# Patient Record
Sex: Male | Born: 2000 | Race: Black or African American | Hispanic: No | Marital: Single | Smoking: Never smoker
Health system: Southern US, Community
[De-identification: ages and names within clinical notes are randomized; demographics above are authoritative.]

## PROBLEM LIST (undated history)

## (undated) DIAGNOSIS — F319 Bipolar disorder, unspecified: Secondary | ICD-10-CM

## (undated) DIAGNOSIS — E559 Vitamin D deficiency, unspecified: Secondary | ICD-10-CM

## (undated) DIAGNOSIS — F909 Attention-deficit hyperactivity disorder, unspecified type: Secondary | ICD-10-CM

## (undated) DIAGNOSIS — K5090 Crohn's disease, unspecified, without complications: Secondary | ICD-10-CM

## (undated) DIAGNOSIS — Z8659 Personal history of other mental and behavioral disorders: Secondary | ICD-10-CM

## (undated) HISTORY — DX: Attention-deficit hyperactivity disorder, unspecified type: F90.9

## (undated) HISTORY — DX: Crohn's disease, unspecified, without complications: K50.90

## (undated) HISTORY — DX: Vitamin D deficiency, unspecified: E55.9

---
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
## 2021-08-28 ENCOUNTER — Other Ambulatory Visit: Payer: Self-pay

## 2021-08-28 ENCOUNTER — Emergency Department (HOSPITAL_COMMUNITY)
Admission: EM | Admit: 2021-08-28 | Discharge: 2021-08-28 | Disposition: A | Discharge status: Home / Self Care | Payer: No Typology Code available for payment source | Attending: Emergency Medicine | Admitting: Emergency Medicine

## 2021-08-28 ENCOUNTER — Emergency Department (HOSPITAL_COMMUNITY): Payer: No Typology Code available for payment source

## 2021-08-28 DIAGNOSIS — Y92410 Unspecified street and highway as the place of occurrence of the external cause: Secondary | ICD-10-CM | POA: Diagnosis not present

## 2021-08-28 DIAGNOSIS — S301XXA Contusion of abdominal wall, initial encounter: Secondary | ICD-10-CM | POA: Insufficient documentation

## 2021-08-28 DIAGNOSIS — S8992XA Unspecified injury of left lower leg, initial encounter: Secondary | ICD-10-CM | POA: Diagnosis present

## 2021-08-28 DIAGNOSIS — S86912A Strain of unspecified muscle(s) and tendon(s) at lower leg level, left leg, initial encounter: Secondary | ICD-10-CM

## 2021-08-28 DIAGNOSIS — S8392XA Sprain of unspecified site of left knee, initial encounter: Secondary | ICD-10-CM | POA: Insufficient documentation

## 2021-08-28 LAB — I-STAT CHEM 8, ED
BUN: 10 mg/dL (ref 6–20)
Calcium, Ion: 1.21 mmol/L (ref 1.15–1.40)
Chloride: 100 mmol/L (ref 98–111)
Creatinine, Ser: 0.8 mg/dL (ref 0.61–1.24)
Glucose, Bld: 94 mg/dL (ref 70–99)
HCT: 48 % (ref 39.0–52.0)
Hemoglobin: 16.3 g/dL (ref 13.0–17.0)
Potassium: 3.8 mmol/L (ref 3.5–5.1)
Sodium: 138 mmol/L (ref 135–145)
TCO2: 28 mmol/L (ref 22–32)

## 2021-08-28 LAB — COMPREHENSIVE METABOLIC PANEL
ALT: 20 U/L (ref 0–44)
AST: 16 U/L (ref 15–41)
Albumin: 4.1 g/dL (ref 3.5–5.0)
Alkaline Phosphatase: 91 U/L (ref 38–126)
Anion gap: 8 (ref 5–15)
BUN: 9 mg/dL (ref 6–20)
CO2: 27 mmol/L (ref 22–32)
Calcium: 9.5 mg/dL (ref 8.9–10.3)
Chloride: 102 mmol/L (ref 98–111)
Creatinine, Ser: 0.83 mg/dL (ref 0.61–1.24)
GFR, Estimated: >60 mL/min (ref >60)
Glucose, Bld: 97 mg/dL (ref 70–99)
Potassium: 3.8 mmol/L (ref 3.5–5.1)
Sodium: 137 mmol/L (ref 135–145)
Total Bilirubin: 0.4 mg/dL (ref 0.3–1.2)
Total Protein: 8 g/dL (ref 6.5–8.1)

## 2021-08-28 LAB — CBC WITH DIFFERENTIAL/PLATELET
Abs Immature Granulocytes: 0.03 10*3/uL (ref 0.00–0.07)
Basophils Absolute: 0 10*3/uL (ref 0.0–0.1)
Basophils Relative: 0 %
Eosinophils Absolute: 0.1 10*3/uL (ref 0.0–0.5)
Eosinophils Relative: 1 %
HCT: 45.7 % (ref 39.0–52.0)
Hemoglobin: 14.7 g/dL (ref 13.0–17.0)
Immature Granulocytes: 0 %
Lymphocytes Relative: 25 %
Lymphs Abs: 1.9 10*3/uL (ref 0.7–4.0)
MCH: 26 pg (ref 26.0–34.0)
MCHC: 32.2 g/dL (ref 30.0–36.0)
MCV: 80.7 fL (ref 80.0–100.0)
Monocytes Absolute: 0.5 10*3/uL (ref 0.1–1.0)
Monocytes Relative: 7 %
Neutro Abs: 5.1 10*3/uL (ref 1.7–7.7)
Neutrophils Relative %: 67 %
Platelets: 278 10*3/uL (ref 150–400)
RBC: 5.66 MIL/uL (ref 4.22–5.81)
RDW: 15.6 % — ABNORMAL HIGH (ref 11.5–15.5)
WBC: 7.6 10*3/uL (ref 4.0–10.5)
nRBC: 0 % (ref 0.0–0.2)

## 2021-08-28 MED ORDER — IOHEXOL 300 MG/ML  SOLN
100.0000 mL | Freq: Once | INTRAMUSCULAR | Status: AC | PRN
  Administered 2021-08-28: 100 mL via INTRAVENOUS

## 2021-08-28 MED ORDER — TRAMADOL HCL 50 MG PO TABS: 50.0000 mg | ORAL_TABLET | Freq: Four times a day (QID) | ORAL | 0 refills | Status: DC | PRN
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
## 2021-08-28 NOTE — Discharge Instructions (Addendum)
You do not have any bleeding or fractures right now  Take Motrin for pain.  Take tramadol for muscle spasms or severe pain  See your doctor for follow  Return to ER if you have worse abdominal pain, knee pain
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
## 2021-09-05 ENCOUNTER — Emergency Department (HOSPITAL_COMMUNITY): Payer: No Typology Code available for payment source

## 2021-09-05 ENCOUNTER — Emergency Department (HOSPITAL_COMMUNITY)
Admission: EM | Admit: 2021-09-05 | Discharge: 2021-09-05 | Disposition: A | Discharge status: Home / Self Care | Payer: No Typology Code available for payment source | Attending: Emergency Medicine | Admitting: Emergency Medicine

## 2021-09-05 DIAGNOSIS — R109 Unspecified abdominal pain: Secondary | ICD-10-CM | POA: Diagnosis not present

## 2021-09-05 DIAGNOSIS — M25562 Pain in left knee: Secondary | ICD-10-CM | POA: Insufficient documentation

## 2021-09-05 DIAGNOSIS — M25552 Pain in left hip: Secondary | ICD-10-CM | POA: Insufficient documentation

## 2021-09-05 LAB — BASIC METABOLIC PANEL
Anion gap: 11 (ref 5–15)
BUN: 6 mg/dL (ref 6–20)
CO2: 22 mmol/L (ref 22–32)
Calcium: 9 mg/dL (ref 8.9–10.3)
Chloride: 104 mmol/L (ref 98–111)
Creatinine, Ser: 0.83 mg/dL (ref 0.61–1.24)
GFR, Estimated: >60 mL/min (ref >60)
Glucose, Bld: 99 mg/dL (ref 70–99)
Potassium: 3.7 mmol/L (ref 3.5–5.1)
Sodium: 137 mmol/L (ref 135–145)

## 2021-09-05 LAB — CBC WITH DIFFERENTIAL/PLATELET
Abs Immature Granulocytes: 0.03 10*3/uL (ref 0.00–0.07)
Basophils Absolute: 0 10*3/uL (ref 0.0–0.1)
Basophils Relative: 0 %
Eosinophils Absolute: 0.1 10*3/uL (ref 0.0–0.5)
Eosinophils Relative: 1 %
HCT: 43.9 % (ref 39.0–52.0)
Hemoglobin: 14.3 g/dL (ref 13.0–17.0)
Immature Granulocytes: 0 %
Lymphocytes Relative: 21 %
Lymphs Abs: 1.9 10*3/uL (ref 0.7–4.0)
MCH: 26.5 pg (ref 26.0–34.0)
MCHC: 32.6 g/dL (ref 30.0–36.0)
MCV: 81.4 fL (ref 80.0–100.0)
Monocytes Absolute: 0.6 10*3/uL (ref 0.1–1.0)
Monocytes Relative: 7 %
Neutro Abs: 6.3 10*3/uL (ref 1.7–7.7)
Neutrophils Relative %: 71 %
Platelets: 278 10*3/uL (ref 150–400)
RBC: 5.39 MIL/uL (ref 4.22–5.81)
RDW: 16.1 % — ABNORMAL HIGH (ref 11.5–15.5)
WBC: 9 10*3/uL (ref 4.0–10.5)
nRBC: 0 % (ref 0.0–0.2)

## 2021-09-05 MED ORDER — ACETAMINOPHEN 500 MG PO TABS
1000.0000 mg | ORAL_TABLET | Freq: Once | ORAL | Status: AC
  Administered 2021-09-05: 1000 mg via ORAL
  Filled 2021-09-05: qty 2
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
## 2021-09-05 NOTE — ED Triage Notes (Signed)
Pt. Refused to sign the MSE stated I want my mom to sign.
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
## 2021-09-05 NOTE — Discharge Instructions (Signed)
Please follow up with Orthopedist Dr. Roda Shutters for further evaluation of your ongoing knee pain  Use the crutches and knee immobilizer until you can  be seen by ortho.   You can take 800 mg Ibuprofen (4 OTC tablets) every 8 hours as needed for pain. You can alternate with 1,000 mg Tylenol (2 OTC double strength tablets) every 8 hours. It is recommended that you take Ibuprofen and then at the 4 hour  mark take Tylenol. This way every 4 hours you are taking something for pain.   Pick up the prescription for Tramadol that was prescribed to you last week during your ED visit. You should take this as prescribed but only for break through pain.   DO NOT TAKE ADDITIONAL NSAIDs WITH YOUR IBUPROFEN. THIS INCLUDES ADVIL OR ALEVE.   Return to the ED for any new/worsening symptoms

## 2021-09-05 NOTE — ED Notes (Signed)
Ortho called for device placement.
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
## 2022-06-17 ENCOUNTER — Emergency Department (HOSPITAL_BASED_OUTPATIENT_CLINIC_OR_DEPARTMENT_OTHER)
Admission: EM | Admit: 2022-06-17 | Discharge: 2022-06-17 | Disposition: A | Discharge status: Home / Self Care | Payer: Medicaid Other | Attending: Emergency Medicine | Admitting: Emergency Medicine

## 2022-06-17 ENCOUNTER — Other Ambulatory Visit: Payer: Self-pay

## 2022-06-17 ENCOUNTER — Encounter (HOSPITAL_BASED_OUTPATIENT_CLINIC_OR_DEPARTMENT_OTHER): Payer: Self-pay | Admitting: Emergency Medicine

## 2022-06-17 DIAGNOSIS — S0990XA Unspecified injury of head, initial encounter: Secondary | ICD-10-CM | POA: Diagnosis present

## 2022-06-17 DIAGNOSIS — Z79899 Other long term (current) drug therapy: Secondary | ICD-10-CM | POA: Diagnosis not present

## 2022-06-17 DIAGNOSIS — Y92410 Unspecified street and highway as the place of occurrence of the external cause: Secondary | ICD-10-CM | POA: Diagnosis not present

## 2022-06-17 DIAGNOSIS — S161XXA Strain of muscle, fascia and tendon at neck level, initial encounter: Secondary | ICD-10-CM | POA: Diagnosis not present

## 2022-06-17 MED ORDER — METHOCARBAMOL 500 MG PO TABS: 500.0000 mg | ORAL_TABLET | Freq: Two times a day (BID) | ORAL | 0 refills | Status: AC

## 2022-06-17 MED ORDER — NAPROXEN 500 MG PO TABS: 500.0000 mg | ORAL_TABLET | Freq: Two times a day (BID) | ORAL | 0 refills | Status: AC

## 2022-06-17 MED ORDER — NAPROXEN 250 MG PO TABS
500.0000 mg | ORAL_TABLET | Freq: Once | ORAL | Status: AC
  Administered 2022-06-17: 500 mg via ORAL
  Filled 2022-06-17: qty 2

## 2022-06-17 MED ORDER — METHOCARBAMOL 500 MG PO TABS
500.0000 mg | ORAL_TABLET | Freq: Once | ORAL | Status: AC
Start: 2022-06-17 — End: 2022-06-17
  Administered 2022-06-17: 500 mg via ORAL
  Filled 2022-06-17: qty 1
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
## 2022-06-21 ENCOUNTER — Emergency Department (HOSPITAL_BASED_OUTPATIENT_CLINIC_OR_DEPARTMENT_OTHER): Payer: Medicaid Other

## 2022-06-21 ENCOUNTER — Other Ambulatory Visit: Payer: Self-pay

## 2022-06-21 ENCOUNTER — Emergency Department (HOSPITAL_BASED_OUTPATIENT_CLINIC_OR_DEPARTMENT_OTHER)
Admission: EM | Admit: 2022-06-21 | Discharge: 2022-06-21 | Disposition: A | Discharge status: Home / Self Care | Payer: Medicaid Other | Attending: Emergency Medicine | Admitting: Emergency Medicine

## 2022-06-21 DIAGNOSIS — S0990XA Unspecified injury of head, initial encounter: Secondary | ICD-10-CM | POA: Diagnosis present

## 2022-06-21 DIAGNOSIS — Y92410 Unspecified street and highway as the place of occurrence of the external cause: Secondary | ICD-10-CM | POA: Insufficient documentation

## 2022-06-21 DIAGNOSIS — M542 Cervicalgia: Secondary | ICD-10-CM | POA: Insufficient documentation
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
## 2022-06-30 ENCOUNTER — Other Ambulatory Visit: Payer: Self-pay

## 2022-06-30 ENCOUNTER — Encounter (HOSPITAL_BASED_OUTPATIENT_CLINIC_OR_DEPARTMENT_OTHER): Payer: Self-pay | Admitting: Emergency Medicine

## 2022-06-30 ENCOUNTER — Emergency Department (HOSPITAL_BASED_OUTPATIENT_CLINIC_OR_DEPARTMENT_OTHER): Payer: Medicaid Other

## 2022-06-30 ENCOUNTER — Emergency Department (HOSPITAL_BASED_OUTPATIENT_CLINIC_OR_DEPARTMENT_OTHER)
Admission: EM | Admit: 2022-06-30 | Discharge: 2022-06-30 | Disposition: A | Discharge status: Home / Self Care | Payer: Medicaid Other | Attending: Emergency Medicine | Admitting: Emergency Medicine

## 2022-06-30 DIAGNOSIS — Y92410 Unspecified street and highway as the place of occurrence of the external cause: Secondary | ICD-10-CM | POA: Insufficient documentation

## 2022-06-30 DIAGNOSIS — M542 Cervicalgia: Secondary | ICD-10-CM | POA: Diagnosis not present

## 2022-06-30 DIAGNOSIS — S0990XA Unspecified injury of head, initial encounter: Secondary | ICD-10-CM | POA: Insufficient documentation

## 2022-06-30 DIAGNOSIS — S0990XD Unspecified injury of head, subsequent encounter: Secondary | ICD-10-CM

## 2022-06-30 NOTE — ED Notes (Signed)
Pt discharged home after verbalizing understanding of discharge instructions; nad noted. 
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
## 2022-06-30 NOTE — ED Notes (Signed)
Patient transported to CT 

## 2022-07-30 ENCOUNTER — Encounter: Payer: Self-pay | Admitting: Internal Medicine

## 2022-08-15 ENCOUNTER — Inpatient Hospital Stay (HOSPITAL_COMMUNITY): Admission: RE | Admit: 2022-08-15 | Payer: Self-pay | Source: Ambulatory Visit

## 2022-08-15 ENCOUNTER — Emergency Department (HOSPITAL_BASED_OUTPATIENT_CLINIC_OR_DEPARTMENT_OTHER)
Admission: EM | Admit: 2022-08-15 | Discharge: 2022-08-15 | Disposition: A | Discharge status: Home / Self Care | Payer: Medicaid Other | Attending: Emergency Medicine | Admitting: Emergency Medicine

## 2022-08-15 ENCOUNTER — Encounter (HOSPITAL_BASED_OUTPATIENT_CLINIC_OR_DEPARTMENT_OTHER): Payer: Self-pay | Admitting: Emergency Medicine

## 2022-08-15 ENCOUNTER — Other Ambulatory Visit: Payer: Self-pay

## 2022-08-15 DIAGNOSIS — R197 Diarrhea, unspecified: Secondary | ICD-10-CM | POA: Diagnosis not present

## 2022-08-15 DIAGNOSIS — R519 Headache, unspecified: Secondary | ICD-10-CM

## 2022-08-15 DIAGNOSIS — R112 Nausea with vomiting, unspecified: Secondary | ICD-10-CM | POA: Insufficient documentation

## 2022-08-15 DIAGNOSIS — R509 Fever, unspecified: Secondary | ICD-10-CM

## 2022-08-15 DIAGNOSIS — Z1152 Encounter for screening for COVID-19: Secondary | ICD-10-CM | POA: Diagnosis not present

## 2022-08-15 LAB — CBC WITH DIFFERENTIAL/PLATELET
Abs Immature Granulocytes: 0.05 10*3/uL (ref 0.00–0.07)
Basophils Absolute: 0 10*3/uL (ref 0.0–0.1)
Basophils Relative: 0 %
Eosinophils Absolute: 0.1 10*3/uL (ref 0.0–0.5)
Eosinophils Relative: 1 %
HCT: 49.7 % (ref 39.0–52.0)
Hemoglobin: 16.2 g/dL (ref 13.0–17.0)
Immature Granulocytes: 0 %
Lymphocytes Relative: 4 %
Lymphs Abs: 0.5 10*3/uL — ABNORMAL LOW (ref 0.7–4.0)
MCH: 25.8 pg — ABNORMAL LOW (ref 26.0–34.0)
MCHC: 32.6 g/dL (ref 30.0–36.0)
MCV: 79 fL — ABNORMAL LOW (ref 80.0–100.0)
Monocytes Absolute: 0.5 10*3/uL (ref 0.1–1.0)
Monocytes Relative: 4 %
Neutro Abs: 10.9 10*3/uL — ABNORMAL HIGH (ref 1.7–7.7)
Neutrophils Relative %: 91 %
Platelets: 293 10*3/uL (ref 150–400)
RBC: 6.29 MIL/uL — ABNORMAL HIGH (ref 4.22–5.81)
RDW: 15.1 % (ref 11.5–15.5)
WBC: 12 10*3/uL — ABNORMAL HIGH (ref 4.0–10.5)
nRBC: 0 % (ref 0.0–0.2)

## 2022-08-15 LAB — URINALYSIS, ROUTINE W REFLEX MICROSCOPIC
Bacteria, UA: NONE SEEN
Bilirubin Urine: NEGATIVE
Glucose, UA: NEGATIVE mg/dL
Hgb urine dipstick: NEGATIVE
Ketones, ur: 40 mg/dL — AB
Leukocytes,Ua: NEGATIVE
Nitrite: NEGATIVE
Protein, ur: 30 mg/dL — AB
Specific Gravity, Urine: 1.039 — ABNORMAL HIGH (ref 1.005–1.030)
pH: 5.5 (ref 5.0–8.0)

## 2022-08-15 LAB — COMPREHENSIVE METABOLIC PANEL
ALT: 19 U/L (ref 0–44)
AST: 14 U/L — ABNORMAL LOW (ref 15–41)
Albumin: 4.8 g/dL (ref 3.5–5.0)
Alkaline Phosphatase: 86 U/L (ref 38–126)
Anion gap: 13 (ref 5–15)
BUN: 14 mg/dL (ref 6–20)
CO2: 22 mmol/L (ref 22–32)
Calcium: 10.1 mg/dL (ref 8.9–10.3)
Chloride: 100 mmol/L (ref 98–111)
Creatinine, Ser: 0.96 mg/dL (ref 0.61–1.24)
GFR, Estimated: >60 mL/min (ref >60)
Glucose, Bld: 113 mg/dL — ABNORMAL HIGH (ref 70–99)
Potassium: 4.2 mmol/L (ref 3.5–5.1)
Sodium: 135 mmol/L (ref 135–145)
Total Bilirubin: 0.9 mg/dL (ref 0.3–1.2)
Total Protein: 9.1 g/dL — ABNORMAL HIGH (ref 6.5–8.1)

## 2022-08-15 LAB — RESP PANEL BY RT-PCR (RSV, FLU A&B, COVID)  RVPGX2
Influenza A by PCR: NEGATIVE
Influenza B by PCR: NEGATIVE
Resp Syncytial Virus by PCR: NEGATIVE
SARS Coronavirus 2 by RT PCR: NEGATIVE

## 2022-08-15 LAB — LIPASE, BLOOD: Lipase: 61 U/L — ABNORMAL HIGH (ref 11–51)

## 2022-08-15 MED ORDER — SODIUM CHLORIDE 0.9 % IV SOLN
12.5000 mg | Freq: Four times a day (QID) | INTRAVENOUS | Status: DC | PRN
  Filled 2022-08-15: qty 0.5

## 2022-08-15 MED ORDER — ACETAMINOPHEN 325 MG PO TABS
650.0000 mg | ORAL_TABLET | Freq: Once | ORAL | Status: AC
  Administered 2022-08-15: 650 mg via ORAL

## 2022-08-15 MED ORDER — ACETAMINOPHEN 325 MG PO TABS
ORAL_TABLET | ORAL | Status: AC
  Filled 2022-08-15: qty 1

## 2022-08-15 MED ORDER — DIPHENHYDRAMINE HCL 50 MG/ML IJ SOLN
25.0000 mg | Freq: Once | INTRAMUSCULAR | Status: AC
  Administered 2022-08-15: 25 mg via INTRAVENOUS
  Filled 2022-08-15: qty 1

## 2022-08-15 MED ORDER — METOCLOPRAMIDE HCL 5 MG/ML IJ SOLN
10.0000 mg | Freq: Once | INTRAMUSCULAR | Status: AC
  Administered 2022-08-15: 10 mg via INTRAVENOUS
  Filled 2022-08-15: qty 2

## 2022-08-15 MED ORDER — KETOROLAC TROMETHAMINE 15 MG/ML IJ SOLN
15.0000 mg | Freq: Once | INTRAMUSCULAR | Status: AC
  Administered 2022-08-15: 15 mg via INTRAVENOUS
  Filled 2022-08-15: qty 1

## 2022-08-15 MED ORDER — SODIUM CHLORIDE 0.9 % IV BOLUS
2000.0000 mL | Freq: Once | INTRAVENOUS | Status: AC
  Administered 2022-08-15: 2000 mL via INTRAVENOUS
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
## 2022-08-15 NOTE — ED Notes (Signed)
EDP at [facility] 

## 2022-08-15 NOTE — ED Triage Notes (Signed)
Pt c/o abd pain, emesis, headache since 1400 today, pt reports SOB and sudden onset numbness "all over his body" after arriving

## 2022-08-15 NOTE — ED Notes (Addendum)
Pt placed in w/c as pt states he is unable to move due to "being too weak", no difficulty noted upon entering triage (pt ambulated with no difficulty noted or reported) or when pacing in triage room and attempting to get into floor, this RN advised pt and family member no rooms available at this time, however, pt will be roomed ASAP, urine specimen cup given to pt and advised sample is needed
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
## 2022-08-15 NOTE — ED Notes (Signed)
Pt attempting to lie in floor during triage stating he is unable to breathe, O2 sat 100%, pt able to speak in complete sentences and does not appear to be in any respiratory distress; pt states he is numb all over his body, pt later states bilateral fingers are numb

## 2022-08-15 NOTE — Discharge Instructions (Addendum)
Call to make an appointment with one of the primary care doctors listed above.  Return to the emergency room if you have any worsening symptoms.

## 2022-08-15 NOTE — ED Notes (Signed)
Pt refused to sign MSE

## 2022-08-15 NOTE — ED Notes (Signed)
This RN offered ODT Zofran to pt for nausea, pt states he "I can't take that. It makes me sicker because of the taste"

## 2022-12-29 ENCOUNTER — Encounter: Payer: Self-pay | Admitting: Gastroenterology

## 2023-03-24 ENCOUNTER — Other Ambulatory Visit: Payer: Self-pay | Admitting: Internal Medicine

## 2023-03-25 LAB — CBC
HCT: 43.9 % (ref 38.5–50.0)
Hemoglobin: 14 g/dL (ref 13.2–17.1)
MCH: 26.1 pg — ABNORMAL LOW (ref 27.0–33.0)
MCHC: 31.9 g/dL — ABNORMAL LOW (ref 32.0–36.0)
MCV: 81.9 fL (ref 80.0–100.0)
MPV: 12.2 fL (ref 7.5–12.5)
Platelets: 275 10*3/uL (ref 140–400)
RBC: 5.36 10*6/uL (ref 4.20–5.80)
RDW: 14.2 % (ref 11.0–15.0)
WBC: 4.9 10*3/uL (ref 3.8–10.8)

## 2023-03-25 LAB — LIPID PANEL
Cholesterol: 126 mg/dL (ref <200)
HDL: 34 mg/dL — ABNORMAL LOW (ref >=40)
LDL Cholesterol (Calc): 77 mg/dL
Non-HDL Cholesterol (Calc): 92 mg/dL (ref <130)
Total CHOL/HDL Ratio: 3.7 (calc) (ref <5.0)
Triglycerides: 70 mg/dL (ref <150)

## 2023-03-25 LAB — COMPLETE METABOLIC PANEL WITH GFR
AG Ratio: 1.4 (calc) (ref 1.0–2.5)
ALT: 15 U/L (ref 9–46)
AST: 13 U/L (ref 10–40)
Albumin: 4.4 g/dL (ref 3.6–5.1)
Alkaline phosphatase (APISO): 78 U/L (ref 36–130)
BUN: 11 mg/dL (ref 7–25)
CO2: 21 mmol/L (ref 20–32)
Calcium: 9.7 mg/dL (ref 8.6–10.3)
Chloride: 101 mmol/L (ref 98–110)
Creat: 0.97 mg/dL (ref 0.60–1.24)
Globulin: 3.2 g/dL (ref 1.9–3.7)
Glucose, Bld: 80 mg/dL (ref 65–99)
Potassium: 4.5 mmol/L (ref 3.5–5.3)
Sodium: 134 mmol/L — ABNORMAL LOW (ref 135–146)
Total Bilirubin: 0.7 mg/dL (ref 0.2–1.2)
Total Protein: 7.6 g/dL (ref 6.1–8.1)
eGFR: 113 mL/min/{1.73_m2} (ref >=60)

## 2023-03-25 LAB — VITAMIN D 25 HYDROXY (VIT D DEFICIENCY, FRACTURES): Vit D, 25-Hydroxy: 19 ng/mL — ABNORMAL LOW (ref 30–100)

## 2023-04-01 ENCOUNTER — Encounter: Payer: Self-pay | Admitting: Gastroenterology

## 2023-04-02 ENCOUNTER — Ambulatory Visit: Payer: Medicaid Other | Admitting: Gastroenterology

## 2023-04-02 ENCOUNTER — Ambulatory Visit (INDEPENDENT_AMBULATORY_CARE_PROVIDER_SITE_OTHER): Payer: MEDICAID | Admitting: Gastroenterology

## 2023-04-02 ENCOUNTER — Encounter: Payer: Self-pay | Admitting: Gastroenterology

## 2023-04-02 ENCOUNTER — Other Ambulatory Visit (INDEPENDENT_AMBULATORY_CARE_PROVIDER_SITE_OTHER): Payer: MEDICAID

## 2023-04-02 VITALS — BP 110/68 | HR 70 | Ht 73.0 in | Wt 210.0 lb

## 2023-04-02 DIAGNOSIS — K5000 Crohn's disease of small intestine without complications: Secondary | ICD-10-CM

## 2023-04-02 LAB — IBC + FERRITIN
Ferritin: 47.8 ng/mL (ref 22.0–322.0)
Iron: 51 ug/dL (ref 42–165)
Saturation Ratios: 14.7 % — ABNORMAL LOW (ref 20.0–50.0)
TIBC: 345.8 ug/dL (ref 250.0–450.0)
Transferrin: 247 mg/dL (ref 212.0–360.0)

## 2023-04-02 LAB — C-REACTIVE PROTEIN: CRP: <1 mg/dL (ref 0.5–20.0)

## 2023-04-02 MED ORDER — NA SULFATE-K SULFATE-MG SULF 17.5-3.13-1.6 GM/177ML PO SOLN: 1.0000 | Freq: Once | ORAL | 0 refills | Status: AC

## 2023-04-02 NOTE — Progress Notes (Unsigned)
HPI : Antonio Morales is a 22 y.o. male with a history of Crohn's disease who is referred to [place] by Fleet Contras, MD for further management of Crohn's disease.  The patient was previously followed by Dr. Jacqlyn Krauss of [facility] pediatric gastro[facility], but he has not seen him since 2021.  The patient is a poor medical historian and cannot provide much details about his disease history or previous medical therapy. Review of the notes by Dr. Arlys John indicates that he was diagnosed with Crohn's disease in 2019 when a colonoscopy demonstrated the presence of terminal ileitis.  The patient had been maintained on methotrexate for a few years, but it seems that the patient self discontinued methotrexate in 2021 or 2022.  He reports side effects of nausea from methotrexate.  The notes from Dr. Jacqlyn Krauss did not indicate any of this.  He has not been on any medical therapy for the past year or 2.  He does not know if he was treated with any medications prior to methotrexate.  He does not think he has been on steroids.  He has not required surgery for Crohn's disease. The patient reports that his index symptoms of Crohn's disease were abdominal pain, diarrhea and profound fatigue/sleepiness.  He states that he has had the symptoms for many years, and his symptoms did not change at all when he was on the methotrexate.  He did not notice a difference in his symptoms when he stopped methotrexate. He reports feeling tired and sleepy all the time.  He reports abdominal pain which hurts all day.  The pain is not well localized and involves his entire abdomen.  He does note that the pain is worse when he drinks milk or when he overeats. 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 He typically has 2-3 bowel movements per day.  His stools are almost always poorly formed and mushy in consistency.  Rarely has a formed stool.  He has seen small amounts of blood on occasion.  He  states that he is unable to work because of his GI symptoms.  The reason he states he sought reengagement with gastroenterology was because his mother was making him get a job.  No history of EIM's.    Colonoscopy February 2019 (Dr. Jacqlyn Krauss) Normal colon, biopsied Terminal ileum: Altered vascular, congested, erythematous and ulcerated mucosa, biopsied  EGD February 2019 (Dr. Jacqlyn Krauss) Normal esophagus, biopsied Gastritis, biopsied Normal duodenum, biopsied   Pathology results Esophagus: Normal Stomach: Mild chronic superficial gastritis, no H. pylori Duodenum: Normal Terminal ileum: Moderate chronic active enteritis, with presence of pseudopyloric metaplasia, crypt architectural distortion and Paneth cell hyperplasia Descending colon: Normal Transverse colon: Normal Ascending colon: Normal  Laboratory evaluation September 17, 2017 [facility], Dr. 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 No pertinent surgical history. Family History  Problem Relation Age of Onset   Crohn's disease Maternal Grandfather    Liver disease Neg Hx    Esophageal cancer Neg Hx  Cancer - Colon Neg Hx    Social History   Tobacco Use   Smoking status: Never   Smokeless tobacco: Never  Vaping Use   Vaping status: Former  Substance Use Topics   Alcohol use: No   Drug use: No   Current Outpatient Medications  Medication Sig Dispense Refill   amphetamine-dextroamphetamine (ADDERALL)  30 MG tablet Take 30 mg by mouth 3 (three) times daily.     folic acid (FOLVITE) 1 MG tablet Take 1 tablet (1 mg total) by mouth daily. 30 tablet 5   methocarbamol (ROBAXIN) 500 MG tablet Take 1 tablet (500 mg total) by mouth 2 (two) times daily. 20 tablet 0   naproxen (NAPROSYN) 500 MG tablet Take 1 tablet (500 mg total) by mouth 2 (two) times daily. 30 tablet 0   Oxcarbazepine (TRILEPTAL) 300 MG tablet Take by mouth.     TREXALL 10 MG tablet TAKE 1 TABLET (10 MG TOTAL) BY MOUTH ONCE A WEEK. CAUTION: CHEMOTHERAPY. PROTECT FROM LIGHT. (Patient not taking: Reported on 08/28/2021) 4 tablet 0   No current facility-administered medications for this visit.   No Known Allergies   Review of Systems: All systems reviewed and negative except where noted in HPI.    No results found.  Physical Exam: BP 110/68   Pulse 70   Ht 6\' 1"  (1.854 m)   Wt 210 lb (95.3 kg)   BMI 27.71 kg/m  Constitutional: Pleasant,well-developed, African-American male in no acute distress. HEENT: Normocephalic and atraumatic. Conjunctivae are normal. No scleral icterus. Neck supple.  Cardiovascular: Normal rate, regular rhythm.  Pulmonary/chest: Effort normal and breath sounds normal. No wheezing, rales or rhonchi. Abdominal: Soft, nondistended, diffuse, multifocal tenderness to palpation without rigidity or guarding bowel sounds active throughout. There are no masses palpable. No hepatomegaly. Extremities: no edema Lymphadenopathy: No cervical adenopathy noted. Neurological: Alert and oriented to person place and time. Skin: Skin is warm and dry. No rashes noted. Psychiatric: Normal mood and affect. Behavior is normal.  CBC    Component Value Date/Time   WBC 4.9 03/24/2023 1130   RBC 5.36 03/24/2023 1130   HGB 14.0 03/24/2023 1130   HCT 43.9 03/24/2023 1130   PLT 275 03/24/2023 1130   MCV 81.9 03/24/2023 1130   MCH 26.1 (L) 03/24/2023 1130   MCHC 31.9 (L) 03/24/2023 1130   RDW 14.2 03/24/2023 1130    LYMPHSABS 0.5 (L) 08/15/2022 0110   MONOABS 0.5 08/15/2022 0110   EOSABS 0.1 08/15/2022 0110   BASOSABS 0.0 08/15/2022 0110    CMP     Component Value Date/Time   NA 134 (L) 03/24/2023 1130   K 4.5 03/24/2023 1130   CL 101 03/24/2023 1130   CO2 21 03/24/2023 1130   GLUCOSE 80 03/24/2023 1130   BUN 11 03/24/2023 1130   CREATININE 0.97 03/24/2023 1130   CALCIUM 9.7 03/24/2023 1130   PROT 7.6 03/24/2023 1130   ALBUMIN 4.8 08/15/2022 0110   AST 13 03/24/2023 1130   ALT 15 03/24/2023 1130   ALKPHOS 86 08/15/2022 0110   BILITOT 0.7 03/24/2023 1130   GFRNONAA >60 08/15/2022 0110   GFRAA NOT CALCULATED 07/11/2014 1323       Latest Ref Rng & Units 03/24/2023   11:30 AM 08/15/2022    1:10 AM 09/05/2021   12:03 PM  CBC EXTENDED  WBC 3.8 - 10.8 Thousand/uL 4.9  12.0  9.0   RBC 4.20 - 5.80 Million/uL 5.36  6.29  5.39   Hemoglobin 13.2 - 17.1  g/dL 62.1  30.8  65.7   HCT 38.5 - 50.0 % 43.9  49.7  43.9   Platelets 140 - 400 Thousand/uL 275  293  278   NEUT# 1.7 - 7.7 K/uL  10.9  6.3   Lymph# 0.7 - 4.0 K/uL  0.5  1.9       ASSESSMENT AND PLAN: 22 year old male with uncomplicated inflammatory Crohn's disease limited to the terminal ileum, previously treated with methotrexate (although unclear if other agents were previously tried), off all medical therapy for at least 1 to 2 years, with continued symptoms of generalized abdominal pain and poorly formed stools, as well as profound fatigue.  According to the patient, his symptoms have been the same for several years, and did not improve with methotrexate.  I was unable to find any evidence of any objective evidence of remission of his Crohn's disease on methotrexate (i.e., calprotectin, imaging, or repeat endoscopy). Based on the patient's reports, his symptoms are not particularly any different now than they were several years ago (on therapy compared off therapy).  I would like to restage his disease, to assess the extent and severity of his  Crohn's disease before reinitiating any medical therapy.  We will schedule him for an EGD and colonoscopy.  Will get baseline inflammatory markers and screen for varicella, tuberculosis and hepatitis in anticipation of likely biologic therapy, as well as TPMT.  He recently had a CBC and CMP which were unremarkable.  We will check iron panel and B12/folate. We discussed the chronic nature of Crohn's disease, and the need for medical therapy to control his disease and reduce the risk of progression/complication from Crohn's disease.  We discussed the potential risks of Crohn's disease to include stricture/stenosis with obstruction, as well as fistulization, perianal disease, bowel perforation.  He is aware of the need for routine and close follow-up with gastroenterology.  He was advised to avoid NSAIDs.  He was advised that not medically treating his Crohn's disease increases risk for complications, surgery and poor outcomes.  Crohn's disease, small bowel, uncomplicated - EGD, colonoscopy - Inflammatory markers (calprotectin, CRP, ESR) - VZV IgG, QTF gold, HBV sAg, HCV, TPMT -Iron panel, B12/folate  The details, risks (including bleeding, perforation, infection, missed lesions, medication reactions and possible hospitalization or surgery if complications occur), benefits, and alternatives to EGD/colonoscopy with possible biopsy and possible polypectomy were discussed with the patient and he consents to proceed.   Natonya Finstad E. Tomasa Rand, MD [facility]  I spent a total of 50 minutes reviewing the patient's medical record, interviewing and examining the patient, discussing her diagnosis and management of her condition going forward, and documenting in the medical record    Fleet Contras, MD

## 2023-04-02 NOTE — Patient Instructions (Addendum)
Your provider has requested that you go to the basement level for lab work before leaving today. Press "B" on the elevator. The lab is located at the first door on the left as you exit the elevator.  You have been scheduled for an endoscopy and colonoscopy. Please follow the written instructions given to you at your visit today.  Please pick up your prep supplies at the pharmacy within the next 1-3 days.  If you use inhalers (even only as needed), please bring them with you on the day of your procedure.  DO NOT TAKE 7 DAYS PRIOR TO TEST- Trulicity (dulaglutide) Ozempic, Wegovy (semaglutide) Mounjaro (tirzepatide) Bydureon Bcise (exanatide extended release)  DO NOT TAKE 1 DAY PRIOR TO YOUR TEST Rybelsus (semaglutide) Adlyxin (lixisenatide) Victoza (liraglutide) Byetta (exanatide) ___________________________________________________________________________   If your blood pressure at your visit was 140/90 or greater, please contact your primary care physician to follow up on this.  _______________________________________________________  If you are age 81 or older, your body mass index should be between 23-30. Your Body mass index is 27.71 kg/m. If this is out of the aforementioned range listed, please consider follow up with your Primary Care Provider.  If you are age 20 or younger, your body mass index should be between 19-25. Your Body mass index is 27.71 kg/m. If this is out of the aformentioned range listed, please consider follow up with your Primary Care Provider.   ________________________________________________________  The [facility] providers would like to encourage you to use [facility] to communicate with providers for non-urgent requests or questions.  Due to long hold times on the telephone, sending your provider a message by [facility] may be a faster and more efficient way to get a response.  Please allow 48 business hours for a response.  Please remember that this is for  non-urgent requests.   It was a pleasure to see you today!  Thank you for trusting me with your gastrointestinal care!    Scott E.Tomasa Rand, MD

## 2023-04-10 LAB — QUANTIFERON-TB GOLD PLUS
Mitogen-NIL: 9.02 [IU]/mL
NIL: 0.02 [IU]/mL
QuantiFERON-TB Gold Plus: NEGATIVE
TB1-NIL: 0 [IU]/mL
TB2-NIL: 0 [IU]/mL

## 2023-04-10 LAB — HEPATITIS C ANTIBODY: Hepatitis C Ab: NONREACTIVE

## 2023-04-10 LAB — HEPATITIS B SURFACE ANTIGEN: Hepatitis B Surface Ag: NONREACTIVE

## 2023-04-10 LAB — VARICELLA ZOSTER ANTIBODY, IGG: Varicella IgG: <135 {index} — ABNORMAL LOW

## 2023-04-10 LAB — HEPATITIS B SURFACE ANTIBODY,QUALITATIVE: Hep B S Ab: NONREACTIVE

## 2023-04-10 LAB — THIOPURINE METHYLTRANSFERASE (TPMT), RBC: Thiopurine Methyltransferase, RBC: 9 nmol/h/mL — ABNORMAL LOW

## 2023-04-15 ENCOUNTER — Other Ambulatory Visit: Payer: Self-pay

## 2023-05-11 ENCOUNTER — Telehealth: Payer: Self-pay | Admitting: Gastroenterology

## 2023-05-11 ENCOUNTER — Encounter: Payer: MEDICAID | Admitting: Gastroenterology

## 2023-05-11 NOTE — Telephone Encounter (Signed)
Hi Dr Tomasa Rand,  This patient texted no to automated reminder call yesterday at 5:01 pm, I called him to double check with him but he did answer. I will cancel patient's appointment for today.   Thank you

## 2023-06-03 ENCOUNTER — Emergency Department (HOSPITAL_BASED_OUTPATIENT_CLINIC_OR_DEPARTMENT_OTHER): Payer: MEDICAID | Admitting: Radiology

## 2023-06-03 ENCOUNTER — Other Ambulatory Visit: Payer: Self-pay

## 2023-06-03 ENCOUNTER — Encounter (HOSPITAL_BASED_OUTPATIENT_CLINIC_OR_DEPARTMENT_OTHER): Payer: Self-pay | Admitting: Emergency Medicine

## 2023-06-03 ENCOUNTER — Emergency Department (HOSPITAL_BASED_OUTPATIENT_CLINIC_OR_DEPARTMENT_OTHER): Payer: MEDICAID

## 2023-06-03 ENCOUNTER — Emergency Department (HOSPITAL_BASED_OUTPATIENT_CLINIC_OR_DEPARTMENT_OTHER)
Admission: EM | Admit: 2023-06-03 | Discharge: 2023-06-03 | Disposition: A | Discharge status: Home / Self Care | Payer: MEDICAID | Attending: Emergency Medicine | Admitting: Emergency Medicine

## 2023-06-03 DIAGNOSIS — Y92410 Unspecified street and highway as the place of occurrence of the external cause: Secondary | ICD-10-CM | POA: Diagnosis not present

## 2023-06-03 DIAGNOSIS — M542 Cervicalgia: Secondary | ICD-10-CM | POA: Diagnosis not present

## 2023-06-03 DIAGNOSIS — M25562 Pain in left knee: Secondary | ICD-10-CM | POA: Diagnosis not present

## 2023-06-03 DIAGNOSIS — S060X0A Concussion without loss of consciousness, initial encounter: Secondary | ICD-10-CM | POA: Diagnosis not present

## 2023-06-03 DIAGNOSIS — G8911 Acute pain due to trauma: Secondary | ICD-10-CM | POA: Diagnosis not present

## 2023-06-03 DIAGNOSIS — S0990XA Unspecified injury of head, initial encounter: Secondary | ICD-10-CM | POA: Diagnosis present

## 2023-06-03 LAB — CBC WITH DIFFERENTIAL/PLATELET
Abs Immature Granulocytes: 0.03 10*3/uL (ref 0.00–0.07)
Basophils Absolute: 0 10*3/uL (ref 0.0–0.1)
Basophils Relative: 0 %
Eosinophils Absolute: 0.1 10*3/uL (ref 0.0–0.5)
Eosinophils Relative: 1 %
HCT: 43.8 % (ref 39.0–52.0)
Hemoglobin: 14.3 g/dL (ref 13.0–17.0)
Immature Granulocytes: 0 %
Lymphocytes Relative: 25 %
Lymphs Abs: 1.8 10*3/uL (ref 0.7–4.0)
MCH: 26.3 pg (ref 26.0–34.0)
MCHC: 32.6 g/dL (ref 30.0–36.0)
MCV: 80.7 fL (ref 80.0–100.0)
Monocytes Absolute: 0.6 10*3/uL (ref 0.1–1.0)
Monocytes Relative: 9 %
Neutro Abs: 4.8 10*3/uL (ref 1.7–7.7)
Neutrophils Relative %: 65 %
Platelets: 280 10*3/uL (ref 150–400)
RBC: 5.43 MIL/uL (ref 4.22–5.81)
RDW: 15 % (ref 11.5–15.5)
WBC: 7.3 10*3/uL (ref 4.0–10.5)
nRBC: 0 % (ref 0.0–0.2)

## 2023-06-03 LAB — COMPREHENSIVE METABOLIC PANEL
ALT: 15 U/L (ref 0–44)
AST: 12 U/L — ABNORMAL LOW (ref 15–41)
Albumin: 4.6 g/dL (ref 3.5–5.0)
Alkaline Phosphatase: 69 U/L (ref 38–126)
Anion gap: 6 (ref 5–15)
BUN: 9 mg/dL (ref 6–20)
CO2: 28 mmol/L (ref 22–32)
Calcium: 9.8 mg/dL (ref 8.9–10.3)
Chloride: 102 mmol/L (ref 98–111)
Creatinine, Ser: 0.87 mg/dL (ref 0.61–1.24)
GFR, Estimated: >60 mL/min (ref >60)
Glucose, Bld: 81 mg/dL (ref 70–99)
Potassium: 3.8 mmol/L (ref 3.5–5.1)
Sodium: 136 mmol/L (ref 135–145)
Total Bilirubin: 0.5 mg/dL (ref <1.2)
Total Protein: 8.3 g/dL — ABNORMAL HIGH (ref 6.5–8.1)

## 2023-06-03 MED ORDER — ONDANSETRON HCL 4 MG PO TABS: 4.0000 mg | ORAL_TABLET | Freq: Three times a day (TID) | ORAL | 0 refills | Status: AC | PRN

## 2023-06-03 MED ORDER — KETOROLAC TROMETHAMINE 15 MG/ML IJ SOLN
15.0000 mg | Freq: Once | INTRAMUSCULAR | Status: AC
  Administered 2023-06-03: 15 mg via INTRAVENOUS
  Filled 2023-06-03: qty 1

## 2023-06-03 MED ORDER — ACETAMINOPHEN 500 MG PO TABS
1000.0000 mg | ORAL_TABLET | Freq: Once | ORAL | Status: AC
  Administered 2023-06-03: 1000 mg via ORAL
  Filled 2023-06-03: qty 2

## 2023-06-03 MED ORDER — ONDANSETRON HCL 4 MG/2ML IJ SOLN
4.0000 mg | Freq: Once | INTRAMUSCULAR | Status: AC
  Administered 2023-06-03: 4 mg via INTRAVENOUS
  Filled 2023-06-03: qty 2

## 2023-06-03 NOTE — ED Notes (Signed)
Discharge paperwork given and verbally understood. 

## 2023-06-03 NOTE — Discharge Instructions (Signed)
It was a pleasure caring for you today.  Workup today was reassuring.  Please follow-up with your primary care provider.  Seek emergency care experiencing any new or worsening symptoms.
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
## 2023-06-03 NOTE — ED Notes (Signed)
Patient transported to CT 
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
